# Patient Record
Sex: Female | Born: 1965 | Hispanic: Yes | Marital: Married | Smoking: Never smoker
Health system: Southern US, Community
[De-identification: ages and names within clinical notes are randomized; demographics above are authoritative.]

## PROBLEM LIST (undated history)

## (undated) DIAGNOSIS — I8393 Asymptomatic varicose veins of bilateral lower extremities: Secondary | ICD-10-CM

## (undated) DIAGNOSIS — E785 Hyperlipidemia, unspecified: Secondary | ICD-10-CM

## (undated) DIAGNOSIS — I10 Essential (primary) hypertension: Secondary | ICD-10-CM

## (undated) DIAGNOSIS — E119 Type 2 diabetes mellitus without complications: Secondary | ICD-10-CM

## (undated) HISTORY — PX: CARDIAC CATHETERIZATION: SHX172

---

## 2006-09-04 ENCOUNTER — Ambulatory Visit: Payer: Self-pay

## 2006-09-24 ENCOUNTER — Other Ambulatory Visit: Payer: Self-pay

## 2006-09-24 ENCOUNTER — Emergency Department: Payer: Self-pay | Admitting: Unknown Physician Specialty

## 2006-09-25 ENCOUNTER — Ambulatory Visit: Payer: Self-pay | Admitting: Unknown Physician Specialty

## 2013-01-28 ENCOUNTER — Ambulatory Visit: Payer: Self-pay

## 2013-04-15 ENCOUNTER — Ambulatory Visit: Payer: Self-pay | Admitting: Internal Medicine

## 2014-03-02 ENCOUNTER — Ambulatory Visit: Payer: Self-pay | Admitting: Nurse Practitioner

## 2015-02-23 ENCOUNTER — Other Ambulatory Visit: Payer: Self-pay | Admitting: Nurse Practitioner

## 2015-02-23 DIAGNOSIS — Z1231 Encounter for screening mammogram for malignant neoplasm of breast: Secondary | ICD-10-CM

## 2015-03-08 ENCOUNTER — Ambulatory Visit
Admission: RE | Admit: 2015-03-08 | Discharge: 2015-03-08 | Disposition: A | Discharge status: Home / Self Care | Payer: BLUE CROSS/BLUE SHIELD | Source: Ambulatory Visit | Attending: Nurse Practitioner | Admitting: Nurse Practitioner

## 2015-03-08 DIAGNOSIS — Z1231 Encounter for screening mammogram for malignant neoplasm of breast: Secondary | ICD-10-CM | POA: Insufficient documentation

## 2016-03-20 ENCOUNTER — Other Ambulatory Visit: Payer: Self-pay | Admitting: Nurse Practitioner

## 2016-03-20 DIAGNOSIS — Z1231 Encounter for screening mammogram for malignant neoplasm of breast: Secondary | ICD-10-CM

## 2016-03-26 ENCOUNTER — Ambulatory Visit
Admission: RE | Admit: 2016-03-26 | Discharge: 2016-03-26 | Disposition: A | Discharge status: Home / Self Care | Payer: BLUE CROSS/BLUE SHIELD | Source: Ambulatory Visit | Attending: Nurse Practitioner | Admitting: Nurse Practitioner

## 2016-03-26 ENCOUNTER — Other Ambulatory Visit: Payer: Self-pay | Admitting: Nurse Practitioner

## 2016-03-26 DIAGNOSIS — Z1231 Encounter for screening mammogram for malignant neoplasm of breast: Secondary | ICD-10-CM

## 2016-10-08 ENCOUNTER — Encounter: Payer: Self-pay | Admitting: *Deleted

## 2016-10-09 ENCOUNTER — Encounter
Admission: RE | Disposition: A | Discharge status: Home / Self Care | Payer: Self-pay | Source: Ambulatory Visit | Attending: Gastroenterology

## 2016-10-09 ENCOUNTER — Encounter: Payer: Self-pay | Admitting: *Deleted

## 2016-10-09 ENCOUNTER — Ambulatory Visit: Payer: BLUE CROSS/BLUE SHIELD | Admitting: Anesthesiology

## 2016-10-09 ENCOUNTER — Ambulatory Visit
Admission: RE | Admit: 2016-10-09 | Discharge: 2016-10-09 | Disposition: A | Discharge status: Home / Self Care | Payer: BLUE CROSS/BLUE SHIELD | Source: Ambulatory Visit | Attending: Gastroenterology | Admitting: Gastroenterology

## 2016-10-09 DIAGNOSIS — K640 First degree hemorrhoids: Secondary | ICD-10-CM | POA: Diagnosis not present

## 2016-10-09 DIAGNOSIS — I10 Essential (primary) hypertension: Secondary | ICD-10-CM | POA: Diagnosis not present

## 2016-10-09 DIAGNOSIS — Z1211 Encounter for screening for malignant neoplasm of colon: Secondary | ICD-10-CM | POA: Diagnosis not present

## 2016-10-09 DIAGNOSIS — E1151 Type 2 diabetes mellitus with diabetic peripheral angiopathy without gangrene: Secondary | ICD-10-CM | POA: Diagnosis not present

## 2016-10-09 DIAGNOSIS — E785 Hyperlipidemia, unspecified: Secondary | ICD-10-CM | POA: Insufficient documentation

## 2016-10-09 DIAGNOSIS — K5730 Diverticulosis of large intestine without perforation or abscess without bleeding: Secondary | ICD-10-CM | POA: Insufficient documentation

## 2016-10-09 DIAGNOSIS — D125 Benign neoplasm of sigmoid colon: Secondary | ICD-10-CM | POA: Diagnosis not present

## 2016-10-09 DIAGNOSIS — Z7984 Long term (current) use of oral hypoglycemic drugs: Secondary | ICD-10-CM | POA: Diagnosis not present

## 2016-10-09 DIAGNOSIS — K621 Rectal polyp: Secondary | ICD-10-CM | POA: Insufficient documentation

## 2016-10-09 DIAGNOSIS — Z79899 Other long term (current) drug therapy: Secondary | ICD-10-CM | POA: Diagnosis not present

## 2016-10-09 HISTORY — DX: Type 2 diabetes mellitus without complications: E11.9

## 2016-10-09 HISTORY — PX: COLONOSCOPY WITH PROPOFOL: SHX5780

## 2016-10-09 HISTORY — DX: Essential (primary) hypertension: I10

## 2016-10-09 HISTORY — DX: Asymptomatic varicose veins of bilateral lower extremities: I83.93

## 2016-10-09 HISTORY — DX: Hyperlipidemia, unspecified: E78.5

## 2016-10-09 LAB — GLUCOSE, CAPILLARY: GLUCOSE-CAPILLARY: 81 mg/dL (ref 65–99)

## 2016-10-09 SURGERY — COLONOSCOPY WITH PROPOFOL
Anesthesia: General

## 2016-10-09 MED ORDER — SODIUM CHLORIDE 0.9 % IV SOLN
INTRAVENOUS | Status: DC
  Administered 2016-10-09 (×3): via INTRAVENOUS

## 2016-10-09 MED ORDER — SODIUM CHLORIDE 0.9 % IV SOLN: INTRAVENOUS | Status: DC

## 2016-10-09 MED ORDER — EPHEDRINE SULFATE 50 MG/ML IJ SOLN
INTRAMUSCULAR | Status: DC | PRN
  Administered 2016-10-09: 10 mg via INTRAVENOUS

## 2016-10-09 MED ORDER — PROPOFOL 500 MG/50ML IV EMUL
INTRAVENOUS | Status: DC | PRN
  Administered 2016-10-09: 100 ug/kg/min via INTRAVENOUS

## 2016-10-09 NOTE — OR Nursing (Signed)
Left hand IV discontinued with intact catheter and site without redness or bruising.  Small iv dressing applied .

## 2016-10-09 NOTE — H&P (Signed)
Outpatient short stay form Pre-procedure 10/09/2016 12:58 PM Lollie Sails MD  Primary Physician: Gaetano Net NP  Reason for visit:  Colonoscopy  History of present illness:  Patient is a 51 year old female presenting today for screening colonoscopy. This her first colonoscopy. She tolerated her prep well. She takes no aspirin or blood thinning agents.    Current Facility-Administered Medications:  .  0.9 %  sodium chloride infusion, , Intravenous, Continuous, Lollie Sails, MD, Last Rate: 20 mL/hr at 10/09/16 1223 .  0.9 %  sodium chloride infusion, , Intravenous, Continuous, Lollie Sails, MD  Prescriptions Prior to Admission  Medication Sig Dispense Refill Last Dose  . carvedilol (COREG) 25 MG tablet Take 25 mg by mouth 2 (two) times daily with a meal.   10/08/2016 at Unknown time  . cyanocobalamin 1000 MCG tablet Take 1,000 mcg by mouth daily.   10/08/2016 at Unknown time  . hydrochlorothiazide (HYDRODIURIL) 25 MG tablet Take 25 mg by mouth daily.   10/08/2016 at Unknown time  . losartan (COZAAR) 100 MG tablet Take 100 mg by mouth daily.   10/08/2016 at Unknown time  . metFORMIN (GLUCOPHAGE-XR) 500 MG 24 hr tablet Take 1,000 mg by mouth daily with breakfast.   Past Week at Unknown time  . Multiple Vitamin (MULTIVITAMIN) tablet Take 1 tablet by mouth daily.   Past Week at Unknown time  . pravastatin (PRAVACHOL) 20 MG tablet Take 20 mg by mouth daily.   10/08/2016 at Unknown time     Allergies  Allergen Reactions  . Penicillins Other (See Comments)    Anxiety     Past Medical History:  Diagnosis Date  . Diabetes mellitus without complication ([place])   . Hyperlipidemia   . Hypertension   . Varicose veins of legs     Review of systems:      Physical Exam    Heart and lungs: Regular rate and rhythm without rub or gallop, lungs are bilaterally clear.    HEENT: Normocephalic atraumatic eyes are anicteric    Other:     Pertinant exam for procedure: Soft nontender  nondistended bowel sounds positive normoactive.    Planned proceedures: Colonoscopy and indicated procedures. I have discussed the risks benefits and complications of procedures to include not limited to bleeding, infection, perforation and the risk of sedation and the patient wishes to proceed.    Lollie Sails, MD Gastroenterology 10/09/2016  12:58 PM

## 2016-10-09 NOTE — Anesthesia Post-op Follow-up Note (Cosign Needed)
Anesthesia QCDR form completed.        

## 2016-10-09 NOTE — Anesthesia Preprocedure Evaluation (Addendum)
Anesthesia Evaluation  Patient identified by MRN, date of birth, ID band Patient awake    Reviewed: Allergy & Precautions, NPO status , Patient's Chart, lab work & pertinent test results, reviewed documented beta blocker date and time   Airway Mallampati: II       Dental no notable dental hx. (+) Chipped   Pulmonary neg pulmonary ROS,    Pulmonary exam normal        Cardiovascular hypertension, Pt. on medications and Pt. on home beta blockers + Peripheral Vascular Disease  Normal cardiovascular exam     Neuro/Psych negative neurological ROS  negative psych ROS   GI/Hepatic negative GI ROS, Neg liver ROS,   Endo/Other  diabetes, Well Controlled, Type 2, Oral Hypoglycemic Agents  Renal/GU negative Renal ROS  negative genitourinary   Musculoskeletal negative musculoskeletal ROS (+)   Abdominal Normal abdominal exam  (+)   Peds  Hematology negative hematology ROS (+)   Anesthesia Other Findings   Reproductive/Obstetrics                            Anesthesia Physical Anesthesia Plan  ASA: III  Anesthesia Plan: General   Post-op Pain Management:    Induction: Intravenous  Airway Management Planned: Nasal Cannula  Additional Equipment:   Intra-op Plan:   Post-operative Plan:   Informed Consent: I have reviewed the patients History and Physical, chart, labs and discussed the procedure including the risks, benefits and alternatives for the proposed anesthesia with the patient or authorized representative who has indicated his/her understanding and acceptance.     Plan Discussed with: CRNA and Surgeon  Anesthesia Plan Comments:         Anesthesia Quick Evaluation

## 2016-10-09 NOTE — Op Note (Signed)
[facility] Gastroenterology Patient Name: Becky Krueger Procedure Date: 10/09/2016 12:21 PM MRN: [id number] Account #: [id number] Date of Birth: [birth year] Admit Type: Outpatient Age: 51 Room: [facility] ENDO ROOM 1 Gender: Female Note Status: Finalized Procedure:            Colonoscopy Indications:          Screening for colorectal malignant neoplasm, This is                        the patient's first colonoscopy Providers:            Lollie Sails, MD Referring MD:         Juluis Rainier (Referring MD) Medicines:            Monitored Anesthesia Care Complications:        No immediate complications. Procedure:            Pre-Anesthesia Assessment:                       - ASA Grade Assessment: II - A patient with mild                        systemic disease.                       After obtaining informed consent, the colonoscope was                        passed under direct vision. Throughout the procedure,                        the patient's blood pressure, pulse, and oxygen                        saturations were monitored continuously. The                        Colonoscope was introduced through the anus and                        advanced to the the cecum, identified by appendiceal                        orifice and ileocecal valve. The colonoscopy was                        performed without difficulty. The patient tolerated the                        procedure well. The quality of the bowel preparation                        was good. Findings:      A 4 mm polyp was found in the distal sigmoid colon. The polyp was       pedunculated. The polyp was removed with a cold snare. Resection and       retrieval were complete.      A 3 mm polyp was found in the rectum. The polyp was flat. The polyp was       removed with a cold biopsy forceps. Resection and retrieval  were       complete.      Non-bleeding internal hemorrhoids were found during  retroflexion and       during anoscopy. The hemorrhoids were small and Grade I (internal       hemorrhoids that do not prolapse).      Multiple small-mouthed diverticula were found in the sigmoid colon and       descending colon. Impression:           - One 4 mm polyp in the distal sigmoid colon, removed                        with a cold snare. Resected and retrieved.                       - One 3 mm polyp in the rectum, removed with a cold                        biopsy forceps. Resected and retrieved.                       - Non-bleeding internal hemorrhoids. Recommendation:       - Discharge patient to home.                       - Advance diet as tolerated. Procedure Code(s):    --- Professional ---                       [phone], Colonoscopy, flexible; with removal of tumor(s),                        polyp(s), or other lesion(s) by snare technique                       45380, 73, Colonoscopy, flexible; with biopsy, single                        or multiple Diagnosis Code(s):    --- Professional ---                       Z12.11, Encounter for screening for malignant neoplasm                        of colon                       D12.5, Benign neoplasm of sigmoid colon                       K62.1, Rectal polyp                       K64.0, First degree hemorrhoids CPT copyright 2016 American Medical Association. All rights reserved. The codes documented in this report are preliminary and upon coder review may  be revised to meet current compliance requirements. Lollie Sails, MD 10/09/2016 1:33:56 PM This report has been signed electronically. Number of Addenda: 0 Note Initiated On: 10/09/2016 12:21 PM Scope Withdrawal Time: 0 hours 14 minutes 32 seconds  Total Procedure Duration: 0 hours 24 minutes 10 seconds       [facility]

## 2016-10-09 NOTE — Transfer of Care (Signed)
Immediate Anesthesia Transfer of Care Note  Patient: Becky Krueger  Procedure(s) Performed: Procedure(s): COLONOSCOPY WITH PROPOFOL (N/A)  Patient Location: PACU  Anesthesia Type:General  Level of Consciousness: awake, alert  and oriented  Airway & Oxygen Therapy: Patient Spontanous Breathing and Patient connected to nasal cannula oxygen  Post-op Assessment: Report given to RN and Post -op Vital signs reviewed and stable  Post vital signs: Reviewed and stable  Last Vitals:  Vitals:   10/09/16 1153 10/09/16 1336  BP: (!) 171/84 (!) 100/49  Pulse: 85 86  Resp: 20 18  Temp: 37.2 C (!) 35.6 C    Last Pain:  Vitals:   10/09/16 1336  TempSrc: Tympanic  PainSc:       Patients Stated Pain Goal: 0 [phone])  Complications: No apparent anesthesia complications

## 2016-10-10 ENCOUNTER — Encounter: Payer: Self-pay | Admitting: Gastroenterology

## 2016-10-10 LAB — SURGICAL PATHOLOGY

## 2016-10-12 NOTE — Anesthesia Postprocedure Evaluation (Signed)
Anesthesia Post Note  Patient: Becky Krueger  Procedure(s) Performed: Procedure(s) (LRB): COLONOSCOPY WITH PROPOFOL (N/A)  Patient location during evaluation: Endoscopy Anesthesia Type: General Level of consciousness: awake and alert Pain management: pain level controlled Vital Signs Assessment: post-procedure vital signs reviewed and stable Respiratory status: spontaneous breathing, nonlabored ventilation, respiratory function stable and patient connected to nasal cannula oxygen Cardiovascular status: blood pressure returned to baseline and stable Postop Assessment: no signs of nausea or vomiting Anesthetic complications: no     Last Vitals:  Vitals:   10/09/16 1356 10/09/16 1416  BP: (!) 109/55 105/70  Pulse: 71 73  Resp: 17 18  Temp:      Last Pain:  Vitals:   10/10/16 0745  TempSrc:   PainSc: 0-No pain                 Martha Clan

## 2017-02-28 ENCOUNTER — Other Ambulatory Visit: Payer: Self-pay | Admitting: Nurse Practitioner

## 2017-02-28 DIAGNOSIS — Z1231 Encounter for screening mammogram for malignant neoplasm of breast: Secondary | ICD-10-CM

## 2017-03-27 ENCOUNTER — Ambulatory Visit
Admission: RE | Admit: 2017-03-27 | Discharge: 2017-03-27 | Disposition: A | Discharge status: Home / Self Care | Payer: BLUE CROSS/BLUE SHIELD | Source: Ambulatory Visit | Attending: Nurse Practitioner | Admitting: Nurse Practitioner

## 2017-03-27 DIAGNOSIS — Z1231 Encounter for screening mammogram for malignant neoplasm of breast: Secondary | ICD-10-CM | POA: Diagnosis present

## 2017-04-03 ENCOUNTER — Ambulatory Visit: Payer: BLUE CROSS/BLUE SHIELD

## 2018-04-30 ENCOUNTER — Other Ambulatory Visit: Payer: Self-pay | Admitting: Nurse Practitioner

## 2018-04-30 DIAGNOSIS — Z1231 Encounter for screening mammogram for malignant neoplasm of breast: Secondary | ICD-10-CM

## 2018-06-05 ENCOUNTER — Encounter (INDEPENDENT_AMBULATORY_CARE_PROVIDER_SITE_OTHER): Payer: Self-pay

## 2018-06-05 ENCOUNTER — Ambulatory Visit
Admission: RE | Admit: 2018-06-05 | Discharge: 2018-06-05 | Disposition: A | Discharge status: Home / Self Care | Payer: BLUE CROSS/BLUE SHIELD | Source: Ambulatory Visit | Attending: Nurse Practitioner | Admitting: Nurse Practitioner

## 2018-06-05 DIAGNOSIS — Z1231 Encounter for screening mammogram for malignant neoplasm of breast: Secondary | ICD-10-CM | POA: Insufficient documentation

## 2019-05-15 ENCOUNTER — Other Ambulatory Visit: Payer: Self-pay | Admitting: Nurse Practitioner

## 2019-05-15 DIAGNOSIS — Z1231 Encounter for screening mammogram for malignant neoplasm of breast: Secondary | ICD-10-CM

## 2019-06-15 ENCOUNTER — Other Ambulatory Visit: Payer: Self-pay

## 2019-06-15 DIAGNOSIS — Z20822 Contact with and (suspected) exposure to covid-19: Secondary | ICD-10-CM

## 2019-06-17 ENCOUNTER — Ambulatory Visit: Payer: BLUE CROSS/BLUE SHIELD

## 2019-06-18 LAB — NOVEL CORONAVIRUS, NAA: SARS-CoV-2, NAA: DETECTED — AB

## 2019-07-20 ENCOUNTER — Encounter (INDEPENDENT_AMBULATORY_CARE_PROVIDER_SITE_OTHER): Payer: Self-pay

## 2019-07-20 ENCOUNTER — Ambulatory Visit
Admission: RE | Admit: 2019-07-20 | Discharge: 2019-07-20 | Disposition: A | Discharge status: Home / Self Care | Payer: BLUE CROSS/BLUE SHIELD | Source: Ambulatory Visit | Attending: Nurse Practitioner | Admitting: Nurse Practitioner

## 2019-07-20 ENCOUNTER — Other Ambulatory Visit: Payer: Self-pay

## 2019-07-20 DIAGNOSIS — Z1231 Encounter for screening mammogram for malignant neoplasm of breast: Secondary | ICD-10-CM | POA: Diagnosis not present

## 2020-08-30 ENCOUNTER — Other Ambulatory Visit: Payer: Self-pay | Admitting: Nurse Practitioner

## 2020-08-30 DIAGNOSIS — Z1231 Encounter for screening mammogram for malignant neoplasm of breast: Secondary | ICD-10-CM

## 2020-09-08 ENCOUNTER — Other Ambulatory Visit: Payer: Self-pay

## 2020-09-08 ENCOUNTER — Ambulatory Visit
Admission: RE | Admit: 2020-09-08 | Discharge: 2020-09-08 | Disposition: A | Discharge status: Home / Self Care | Payer: 59 | Source: Ambulatory Visit | Attending: Nurse Practitioner | Admitting: Nurse Practitioner

## 2020-09-08 DIAGNOSIS — Z1231 Encounter for screening mammogram for malignant neoplasm of breast: Secondary | ICD-10-CM | POA: Diagnosis present

## 2021-08-08 ENCOUNTER — Other Ambulatory Visit: Payer: Self-pay | Admitting: Nurse Practitioner

## 2021-08-08 DIAGNOSIS — Z1231 Encounter for screening mammogram for malignant neoplasm of breast: Secondary | ICD-10-CM

## 2021-09-18 ENCOUNTER — Other Ambulatory Visit: Payer: Self-pay

## 2021-09-18 ENCOUNTER — Ambulatory Visit
Admission: RE | Admit: 2021-09-18 | Discharge: 2021-09-18 | Disposition: A | Discharge status: Home / Self Care | Payer: 59 | Source: Ambulatory Visit | Attending: Nurse Practitioner | Admitting: Nurse Practitioner

## 2021-09-18 DIAGNOSIS — Z1231 Encounter for screening mammogram for malignant neoplasm of breast: Secondary | ICD-10-CM | POA: Diagnosis not present

## 2021-09-29 DIAGNOSIS — E119 Type 2 diabetes mellitus without complications: Secondary | ICD-10-CM | POA: Diagnosis not present

## 2021-09-29 DIAGNOSIS — I10 Essential (primary) hypertension: Secondary | ICD-10-CM | POA: Diagnosis not present

## 2021-09-29 DIAGNOSIS — Z0000 Encounter for general adult medical examination without abnormal findings: Secondary | ICD-10-CM | POA: Diagnosis not present

## 2021-09-29 DIAGNOSIS — E782 Mixed hyperlipidemia: Secondary | ICD-10-CM | POA: Diagnosis not present

## 2021-09-29 DIAGNOSIS — Z79899 Other long term (current) drug therapy: Secondary | ICD-10-CM | POA: Diagnosis not present

## 2022-04-10 DIAGNOSIS — E119 Type 2 diabetes mellitus without complications: Secondary | ICD-10-CM | POA: Diagnosis not present

## 2022-04-10 DIAGNOSIS — E669 Obesity, unspecified: Secondary | ICD-10-CM | POA: Diagnosis not present

## 2022-04-10 DIAGNOSIS — Z79899 Other long term (current) drug therapy: Secondary | ICD-10-CM | POA: Diagnosis not present

## 2022-04-10 DIAGNOSIS — I10 Essential (primary) hypertension: Secondary | ICD-10-CM | POA: Diagnosis not present

## 2022-04-10 DIAGNOSIS — E782 Mixed hyperlipidemia: Secondary | ICD-10-CM | POA: Diagnosis not present

## 2022-04-10 DIAGNOSIS — K219 Gastro-esophageal reflux disease without esophagitis: Secondary | ICD-10-CM | POA: Diagnosis not present

## 2022-04-10 DIAGNOSIS — M62830 Muscle spasm of back: Secondary | ICD-10-CM | POA: Diagnosis not present

## 2022-04-10 DIAGNOSIS — M25522 Pain in left elbow: Secondary | ICD-10-CM | POA: Diagnosis not present

## 2022-05-08 DIAGNOSIS — Z86010 Personal history of colonic polyps: Secondary | ICD-10-CM | POA: Diagnosis not present

## 2022-07-24 ENCOUNTER — Encounter: Payer: Self-pay | Admitting: Internal Medicine

## 2022-07-25 ENCOUNTER — Ambulatory Visit
Admission: RE | Admit: 2022-07-25 | Discharge: 2022-07-25 | Disposition: A | Discharge status: Home / Self Care | Payer: 59 | Attending: Internal Medicine | Admitting: Internal Medicine

## 2022-07-25 ENCOUNTER — Ambulatory Visit: Payer: 59 | Admitting: Certified Registered"

## 2022-07-25 ENCOUNTER — Encounter: Payer: Self-pay | Admitting: Internal Medicine

## 2022-07-25 ENCOUNTER — Encounter
Admission: RE | Disposition: A | Discharge status: Home / Self Care | Payer: Self-pay | Source: Home / Self Care | Attending: Internal Medicine

## 2022-07-25 ENCOUNTER — Other Ambulatory Visit: Payer: Self-pay

## 2022-07-25 DIAGNOSIS — K635 Polyp of colon: Secondary | ICD-10-CM | POA: Insufficient documentation

## 2022-07-25 DIAGNOSIS — Z86010 Personal history of colonic polyps: Secondary | ICD-10-CM | POA: Diagnosis not present

## 2022-07-25 DIAGNOSIS — Z79899 Other long term (current) drug therapy: Secondary | ICD-10-CM | POA: Diagnosis not present

## 2022-07-25 DIAGNOSIS — K640 First degree hemorrhoids: Secondary | ICD-10-CM | POA: Diagnosis not present

## 2022-07-25 DIAGNOSIS — E119 Type 2 diabetes mellitus without complications: Secondary | ICD-10-CM | POA: Insufficient documentation

## 2022-07-25 DIAGNOSIS — K5790 Diverticulosis of intestine, part unspecified, without perforation or abscess without bleeding: Secondary | ICD-10-CM | POA: Diagnosis not present

## 2022-07-25 DIAGNOSIS — Z1211 Encounter for screening for malignant neoplasm of colon: Secondary | ICD-10-CM | POA: Diagnosis not present

## 2022-07-25 DIAGNOSIS — Z7984 Long term (current) use of oral hypoglycemic drugs: Secondary | ICD-10-CM | POA: Diagnosis not present

## 2022-07-25 DIAGNOSIS — I10 Essential (primary) hypertension: Secondary | ICD-10-CM | POA: Diagnosis not present

## 2022-07-25 DIAGNOSIS — D125 Benign neoplasm of sigmoid colon: Secondary | ICD-10-CM | POA: Diagnosis not present

## 2022-07-25 DIAGNOSIS — K5730 Diverticulosis of large intestine without perforation or abscess without bleeding: Secondary | ICD-10-CM | POA: Insufficient documentation

## 2022-07-25 DIAGNOSIS — E785 Hyperlipidemia, unspecified: Secondary | ICD-10-CM | POA: Diagnosis not present

## 2022-07-25 HISTORY — PX: COLONOSCOPY WITH PROPOFOL: SHX5780

## 2022-07-25 LAB — GLUCOSE, CAPILLARY: Glucose-Capillary: 104 mg/dL — ABNORMAL HIGH (ref 70–99)

## 2022-07-25 SURGERY — COLONOSCOPY WITH PROPOFOL
Anesthesia: General

## 2022-07-25 MED ORDER — LIDOCAINE HCL (PF) 2 % IJ SOLN
INTRAMUSCULAR | Status: AC
  Filled 2022-07-25: qty 5

## 2022-07-25 MED ORDER — PROPOFOL 1000 MG/100ML IV EMUL
INTRAVENOUS | Status: AC
  Filled 2022-07-25: qty 100

## 2022-07-25 MED ORDER — SODIUM CHLORIDE 0.9 % IV SOLN: INTRAVENOUS | Status: DC

## 2022-07-25 MED ORDER — LIDOCAINE HCL (CARDIAC) PF 100 MG/5ML IV SOSY
PREFILLED_SYRINGE | INTRAVENOUS | Status: DC | PRN
  Administered 2022-07-25: 100 mg via INTRAVENOUS

## 2022-07-25 MED ORDER — PROPOFOL 500 MG/50ML IV EMUL
INTRAVENOUS | Status: DC | PRN
  Administered 2022-07-25: 140 ug/kg/min via INTRAVENOUS
  Administered 2022-07-25: 100 mg via INTRAVENOUS

## 2022-07-25 NOTE — Op Note (Signed)
[facility] Gastroenterology Patient Name: Becky Krueger Procedure Date: 07/25/2022 10:34 AM MRN: [id number] Account #: [id number] Date of Birth: [birth year] Admit Type: Outpatient Age: 56 Room: [facility] ENDO ROOM 2 Gender: Female Note Status: Finalized Instrument Name: Park Meo [id number] Procedure:             Colonoscopy Indications:           Surveillance: Personal history of adenomatous polyps                         on last colonoscopy > 5 years ago Providers:             Lorie Apley K. Tarnesha Ulloa MD, MD Medicines:             Propofol per Anesthesia Complications:         No immediate complications. Procedure:             Pre-Anesthesia Assessment:                        - The risks and benefits of the procedure and the                         sedation options and risks were discussed with the                         patient. All questions were answered and informed                         consent was obtained.                        - Patient identification and proposed procedure were                         verified prior to the procedure by the nurse. The                         procedure was verified in the procedure room.                        - ASA Grade Assessment: III - A patient with severe                         systemic disease.                        - After reviewing the risks and benefits, the patient                         was deemed in satisfactory condition to undergo the                         procedure.                        After obtaining informed consent, the colonoscope was                         passed under direct vision. Throughout the procedure,  the patient's blood pressure, pulse, and oxygen                         saturations were monitored continuously. The                         Colonoscope was introduced through the anus and                         advanced to the the cecum, identified by appendiceal                          orifice and ileocecal valve. The colonoscopy was                         performed without difficulty. The patient tolerated                         the procedure well. The quality of the bowel                         preparation was adequate. The ileocecal valve,                         appendiceal orifice, and rectum were photographed. Findings:      The perianal and digital rectal examinations were normal. Pertinent       negatives include normal sphincter tone and no palpable rectal lesions.      Non-bleeding internal hemorrhoids were found during retroflexion. The       hemorrhoids were Grade I (internal hemorrhoids that do not prolapse).      Many small-mouthed diverticula were found in the sigmoid colon.      A 5 mm polyp was found in the distal sigmoid colon. The polyp was       sessile. The polyp was removed with a jumbo cold forceps. Resection and       retrieval were complete.      The exam was otherwise without abnormality. Impression:            - Non-bleeding internal hemorrhoids.                        - Diverticulosis in the sigmoid colon.                        - One 5 mm polyp in the distal sigmoid colon, removed                         with a jumbo cold forceps. Resected and retrieved.                        - The examination was otherwise normal. Recommendation:        - Patient has a contact number available for                         emergencies. The signs and symptoms of potential                         delayed complications were discussed with  the patient.                         Return to normal activities tomorrow. Written                         discharge instructions were provided to the patient.                        - Resume previous diet.                        - Continue present medications.                        - Repeat colonoscopy is recommended for surveillance.                         The colonoscopy date will be determined  after                         pathology results from today's exam become available                         for review.                        - Return to GI office PRN.                        - The findings and recommendations were discussed with                         the patient. Procedure Code(s):     --- Professional ---                        [phone], Colonoscopy, flexible; with biopsy, single or                         multiple Diagnosis Code(s):     --- Professional ---                        K57.30, Diverticulosis of large intestine without                         perforation or abscess without bleeding                        D12.5, Benign neoplasm of sigmoid colon                        K64.0, First degree hemorrhoids                        Z86.010, Personal history of colonic polyps CPT copyright 2022 American Medical Association. All rights reserved. The codes documented in this report are preliminary and upon coder review may  be revised to meet current compliance requirements. Efrain Sella MD, MD 07/25/2022 10:58:33 AM This report has been signed electronically. Number of Addenda: 0 Note Initiated On: 07/25/2022 10:34 AM Scope Withdrawal Time: 0 hours 6 minutes 33  seconds  Total Procedure Duration: 0 hours 10 minutes 2 seconds  Estimated Blood Loss:  Estimated blood loss: none.      [facility]

## 2022-07-25 NOTE — Anesthesia Preprocedure Evaluation (Signed)
Anesthesia Evaluation  Patient identified by MRN, date of birth, ID band Patient awake    Reviewed: Allergy & Precautions, H&P , NPO status , Patient's Chart, lab work & pertinent test results, reviewed documented beta blocker date and time   Airway Mallampati: II   Neck ROM: full    Dental  (+) Poor Dentition   Pulmonary neg pulmonary ROS   Pulmonary exam normal        Cardiovascular Exercise Tolerance: Good hypertension, On Medications negative cardio ROS Normal cardiovascular exam Rhythm:regular Rate:Normal     Neuro/Psych negative neurological ROS  negative psych ROS   GI/Hepatic negative GI ROS, Neg liver ROS,,,  Endo/Other  negative endocrine ROSdiabetes, Well Controlled    Renal/GU negative Renal ROS  negative genitourinary   Musculoskeletal   Abdominal   Peds  Hematology negative hematology ROS (+)   Anesthesia Other Findings Past Medical History: No date: Diabetes mellitus without complication ([facility]) No date: Hyperlipidemia No date: Hypertension No date: Varicose veins of legs Past Surgical History: No date: CARDIAC CATHETERIZATION     Comment:  LBBB on EKG.  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  S/P cath showing no blockages and normal EF 10/09/2016: COLONOSCOPY WITH PROPOFOL; N/A     Comment:  Procedure: COLONOSCOPY WITH PROPOFOL;  Surgeon: Lollie Sails, MD;  Location: [facility] ENDOSCOPY;  Service:               Endoscopy;  Laterality: N/A; BMI    Body Mass Index: 29.94 kg/m     Reproductive/Obstetrics negative OB ROS                             Anesthesia Physical Anesthesia Plan  ASA: 2  Anesthesia Plan: General   Post-op Pain Management:    Induction:   PONV Risk Score and Plan:   Airway Management Planned:   Additional Equipment:   Intra-op Plan:   Post-operative Plan:   Informed Consent: I have reviewed the  patients History and Physical, chart, labs and discussed the procedure including the risks, benefits and alternatives for the proposed anesthesia with the patient or authorized representative who has indicated his/her understanding and acceptance.     Dental Advisory Given  Plan Discussed with: CRNA  Anesthesia Plan Comments:        Anesthesia Quick Evaluation

## 2022-07-25 NOTE — Transfer of Care (Signed)
Immediate Anesthesia Transfer of Care Note  Patient: Becky Krueger  Procedure(s) Performed: COLONOSCOPY WITH PROPOFOL  Patient Location: Endoscopy Unit  Anesthesia Type:General  Level of Consciousness: drowsy  Airway & Oxygen Therapy: Patient Spontanous Breathing  Post-op Assessment: Report given to RN and Post -op Vital signs reviewed and stable  Post vital signs: Reviewed and stable  Last Vitals:  Vitals Value Taken Time  BP 96/63 07/25/22 1058  Temp 35.7 C 07/25/22 1058  Pulse 73 07/25/22 1058  Resp 17 07/25/22 1058  SpO2 97 % 07/25/22 1058    Last Pain:  Vitals:   07/25/22 1058  TempSrc: Temporal  PainSc: Asleep         Complications: No notable events documented.

## 2022-07-25 NOTE — Interval H&P Note (Signed)
History and Physical Interval Note:  07/25/2022 10:37 AM  Becky Krueger  has presented today for surgery, with the diagnosis of personal history adenomatous polyp.  The various methods of treatment have been discussed with the patient and family. After consideration of risks, benefits and other options for treatment, the patient has consented to  Procedure(s) with comments: COLONOSCOPY WITH PROPOFOL (N/A) - SPANISH INTERPRETER as a surgical intervention.  The patient's history has been reviewed, patient examined, no change in status, stable for surgery.  I have reviewed the patient's chart and labs.  Questions were answered to the patient's satisfaction.     [place], [place]

## 2022-07-25 NOTE — H&P (Signed)
  Outpatient short stay form Pre-procedure 07/25/2022 10:35 AM Erving Sassano K. Alice Reichert, M.D.  Primary Physician: Gaetano Net, NP  Reason for visit:  Personal history of adenomatous colon polyps  History of present illness:  56 y/o female with personal history of colon polyps.  10/09/2016 Physician: Dr. Kurtis Bushman @ [facility] Polyps Removed: Adenomatous Polyp Patient denies change in bowel habits, rectal bleeding, weight loss or abdominal pain.  History and consent obtained via Medical Spanish interpreter Kennyth Lose).    Current Facility-Administered Medications:    0.9 %  sodium chloride infusion, , Intravenous, Continuous, Virgal [place], Benay Pike, MD, Last Rate: 20 mL/hr at 07/25/22 0950, New Bag at 07/25/22 0950  Medications Prior to Admission  Medication Sig Dispense Refill Last Dose   carvedilol (COREG) 25 MG tablet Take 25 mg by mouth 2 (two) times daily with a meal.   07/25/2022   cyanocobalamin 1000 MCG tablet Take 1,000 mcg by mouth daily.   Past Week   hydrochlorothiazide (HYDRODIURIL) 25 MG tablet Take 25 mg by mouth daily.   Past Week   losartan (COZAAR) 100 MG tablet Take 100 mg by mouth daily.   Past Week   metFORMIN (GLUCOPHAGE-XR) 500 MG 24 hr tablet Take 1,000 mg by mouth daily with breakfast.   Past Week   Multiple Vitamin (MULTIVITAMIN) tablet Take 1 tablet by mouth daily.   Past Week   pravastatin (PRAVACHOL) 20 MG tablet Take 20 mg by mouth daily.   Past Week     Allergies  Allergen Reactions   Penicillins Other (See Comments)    Anxiety     Past Medical History:  Diagnosis Date   Diabetes mellitus without complication ([place])    Hyperlipidemia    Hypertension    Varicose veins of legs     Review of systems:  Otherwise negative.    Physical Exam  Gen: Alert, oriented. Appears stated age.  HEENT: [place]/AT. PERRLA. Lungs: CTA, no wheezes. CV: RR nl S1, S2. Abd: soft, benign, no masses. BS+ Ext: No edema. Pulses 2+    Planned procedures: Proceed with colonoscopy. The  patient understands the nature of the planned procedure, indications, risks, alternatives and potential complications including but not limited to bleeding, infection, perforation, damage to internal organs and possible oversedation/side effects from anesthesia. The patient agrees and gives consent to proceed.  Please refer to procedure notes for findings, recommendations and patient disposition/instructions.     Errika Narvaiz K. Alice Reichert, M.D. Gastroenterology 07/25/2022  10:35 AM

## 2022-07-26 ENCOUNTER — Encounter: Payer: Self-pay | Admitting: Internal Medicine

## 2022-07-26 LAB — SURGICAL PATHOLOGY

## 2022-07-26 NOTE — Anesthesia Postprocedure Evaluation (Signed)
Anesthesia Post Note  Patient: Becky Krueger  Procedure(s) Performed: COLONOSCOPY WITH PROPOFOL  Patient location during evaluation: PACU Anesthesia Type: General Level of consciousness: awake and alert Pain management: pain level controlled Vital Signs Assessment: post-procedure vital signs reviewed and stable Respiratory status: spontaneous breathing, nonlabored ventilation, respiratory function stable and patient connected to nasal cannula oxygen Cardiovascular status: blood pressure returned to baseline and stable Postop Assessment: no apparent nausea or vomiting Anesthetic complications: no   No notable events documented.   Last Vitals:  Vitals:   07/25/22 1058 07/25/22 1108  BP: 96/63 108/67  Pulse: 73 66  Resp: 17 15  Temp: (!) 35.7 C   SpO2: 97% 98%    Last Pain:  Vitals:   07/26/22 0740  TempSrc:   PainSc: 0-No pain                 Molli Barrows

## 2022-08-14 DIAGNOSIS — Z23 Encounter for immunization: Secondary | ICD-10-CM | POA: Diagnosis not present

## 2022-08-14 DIAGNOSIS — S39013A Strain of muscle, fascia and tendon of pelvis, initial encounter: Secondary | ICD-10-CM | POA: Diagnosis not present

## 2022-08-14 DIAGNOSIS — R1031 Right lower quadrant pain: Secondary | ICD-10-CM | POA: Diagnosis not present

## 2022-10-08 ENCOUNTER — Other Ambulatory Visit: Payer: Self-pay | Admitting: Nurse Practitioner

## 2022-10-08 DIAGNOSIS — Z1231 Encounter for screening mammogram for malignant neoplasm of breast: Secondary | ICD-10-CM

## 2022-10-09 DIAGNOSIS — I10 Essential (primary) hypertension: Secondary | ICD-10-CM | POA: Diagnosis not present

## 2022-10-09 DIAGNOSIS — Z79899 Other long term (current) drug therapy: Secondary | ICD-10-CM | POA: Diagnosis not present

## 2022-10-09 DIAGNOSIS — E782 Mixed hyperlipidemia: Secondary | ICD-10-CM | POA: Diagnosis not present

## 2022-10-09 DIAGNOSIS — H9202 Otalgia, left ear: Secondary | ICD-10-CM | POA: Diagnosis not present

## 2022-10-09 DIAGNOSIS — R42 Dizziness and giddiness: Secondary | ICD-10-CM | POA: Diagnosis not present

## 2022-10-09 DIAGNOSIS — Z1231 Encounter for screening mammogram for malignant neoplasm of breast: Secondary | ICD-10-CM | POA: Diagnosis not present

## 2022-10-09 DIAGNOSIS — E669 Obesity, unspecified: Secondary | ICD-10-CM | POA: Diagnosis not present

## 2022-10-09 DIAGNOSIS — Z0000 Encounter for general adult medical examination without abnormal findings: Secondary | ICD-10-CM | POA: Diagnosis not present

## 2022-10-09 DIAGNOSIS — E119 Type 2 diabetes mellitus without complications: Secondary | ICD-10-CM | POA: Diagnosis not present

## 2022-10-09 DIAGNOSIS — K219 Gastro-esophageal reflux disease without esophagitis: Secondary | ICD-10-CM | POA: Diagnosis not present

## 2022-10-16 ENCOUNTER — Ambulatory Visit
Admission: RE | Admit: 2022-10-16 | Discharge: 2022-10-16 | Disposition: A | Discharge status: Home / Self Care | Payer: 59 | Source: Ambulatory Visit | Attending: Nurse Practitioner | Admitting: Nurse Practitioner

## 2022-10-16 DIAGNOSIS — Z1231 Encounter for screening mammogram for malignant neoplasm of breast: Secondary | ICD-10-CM | POA: Diagnosis not present

## 2023-04-15 DIAGNOSIS — E782 Mixed hyperlipidemia: Secondary | ICD-10-CM | POA: Diagnosis not present

## 2023-04-15 DIAGNOSIS — K219 Gastro-esophageal reflux disease without esophagitis: Secondary | ICD-10-CM | POA: Diagnosis not present

## 2023-04-15 DIAGNOSIS — I10 Essential (primary) hypertension: Secondary | ICD-10-CM | POA: Diagnosis not present

## 2023-04-15 DIAGNOSIS — E669 Obesity, unspecified: Secondary | ICD-10-CM | POA: Diagnosis not present

## 2023-04-15 DIAGNOSIS — Z1331 Encounter for screening for depression: Secondary | ICD-10-CM | POA: Diagnosis not present

## 2023-04-15 DIAGNOSIS — E119 Type 2 diabetes mellitus without complications: Secondary | ICD-10-CM | POA: Diagnosis not present

## 2023-04-15 DIAGNOSIS — I83813 Varicose veins of bilateral lower extremities with pain: Secondary | ICD-10-CM | POA: Diagnosis not present

## 2023-04-15 DIAGNOSIS — Z79899 Other long term (current) drug therapy: Secondary | ICD-10-CM | POA: Diagnosis not present

## 2023-06-05 IMAGING — MG MM DIGITAL SCREENING BILAT W/ TOMO AND CAD
8 series · 8 of 24 positions shown · non-contrast
Comparison: Previous exam(s).

CLINICAL DATA: Screening.

EXAM:
DIGITAL SCREENING BILATERAL MAMMOGRAM WITH TOMOSYNTHESIS AND CAD
TECHNIQUE: Bilateral screening digital craniocaudal and mediolateral oblique
mammograms were obtained. Bilateral screening digital breast
tomosynthesis was performed. The images were evaluated with
computer-aided detection.

[R CC synth-2D]
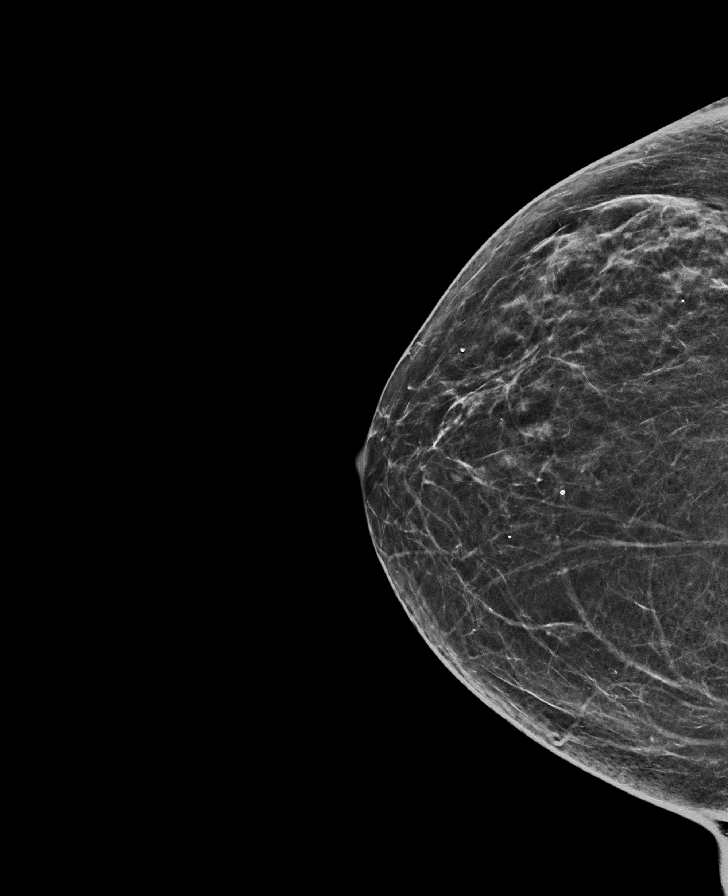

[R MLO synth-2D]
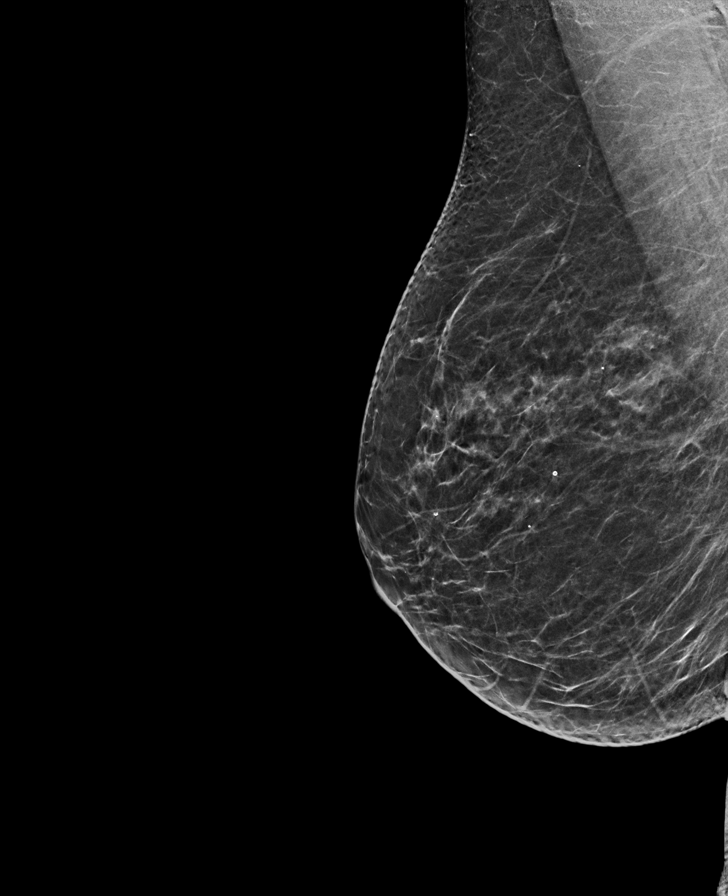

[L MLO synth-2D]
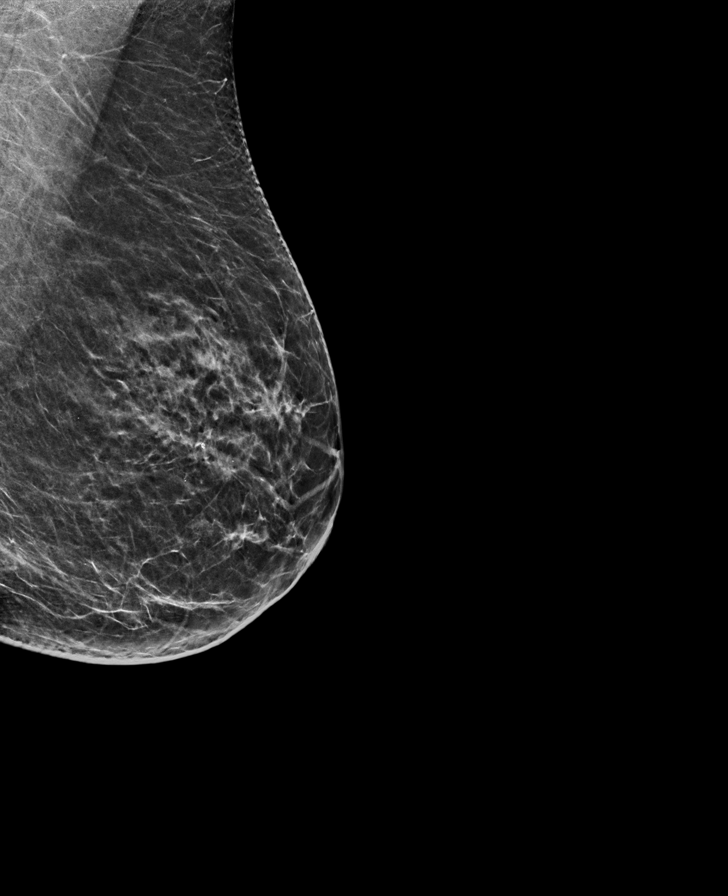

[L CC synth-2D]
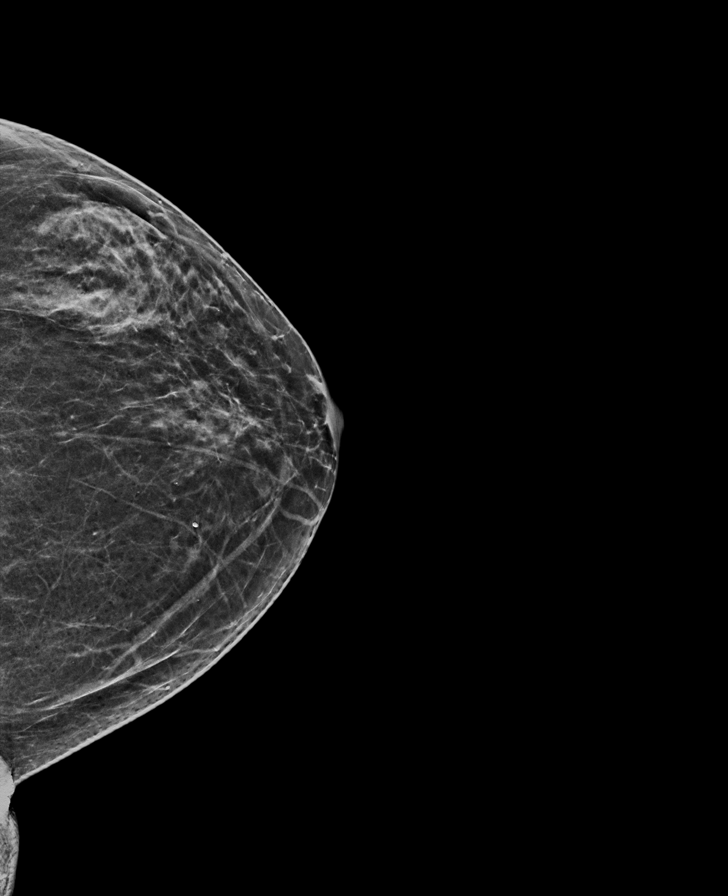

[L MLO tomo · tomo slice 31/60.0]
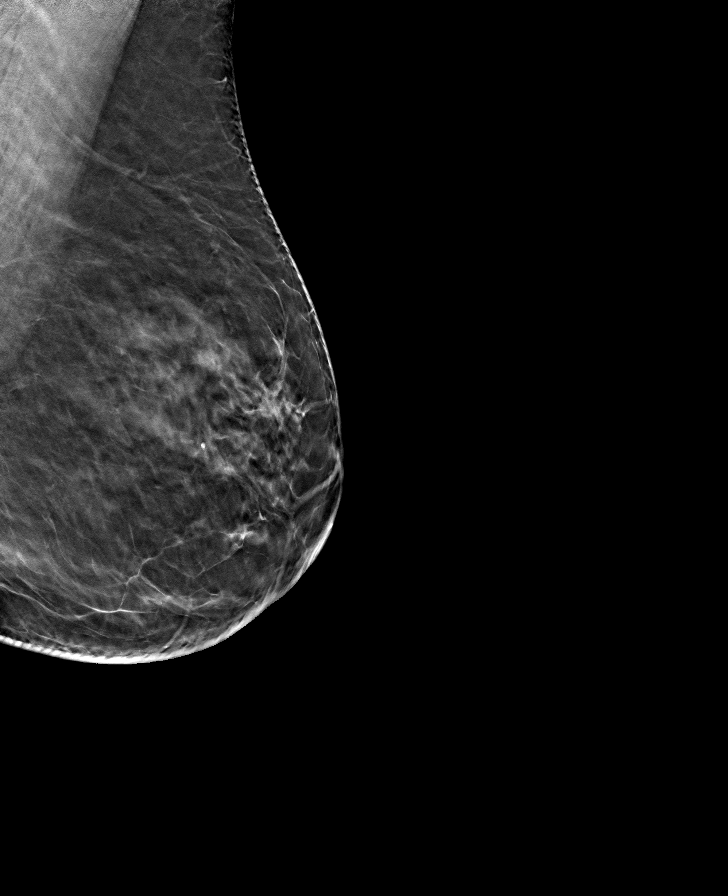

[R MLO tomo · tomo slice 33/64.0]
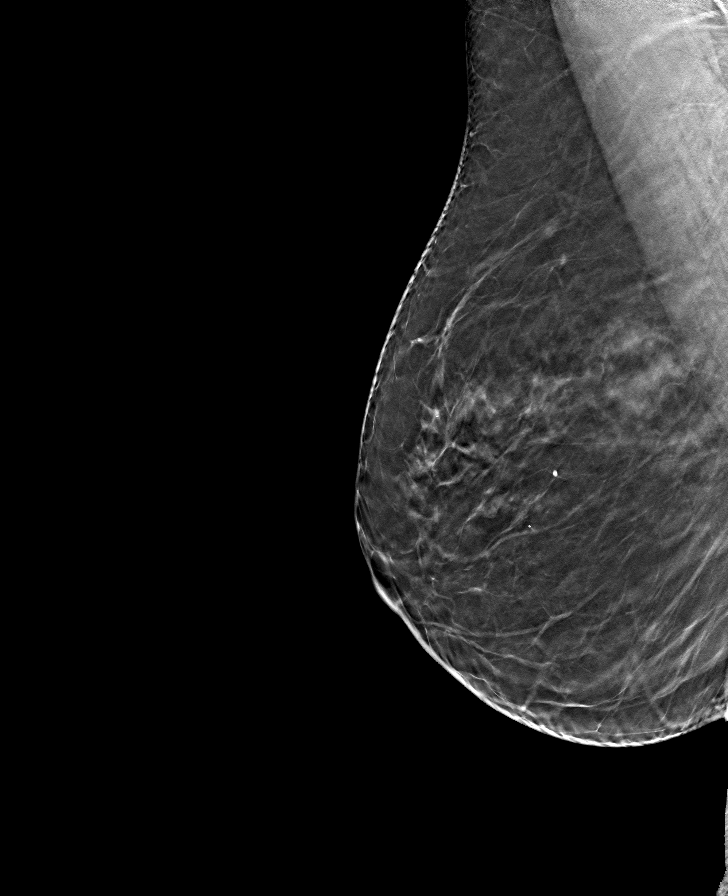

[R CC tomo · tomo slice 31/60.0]
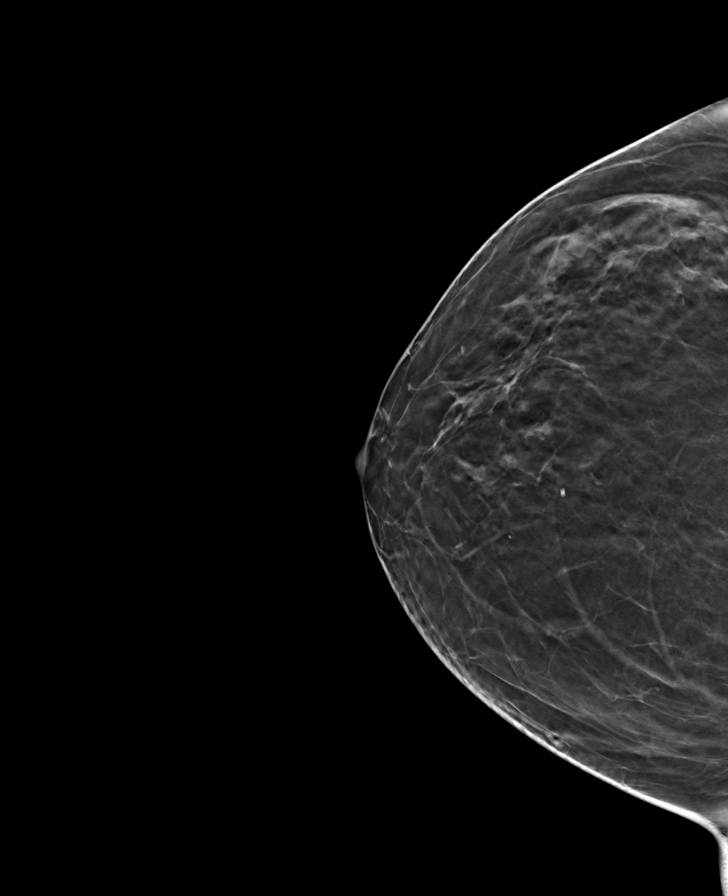

[L CC tomo · tomo slice 27/53.0]
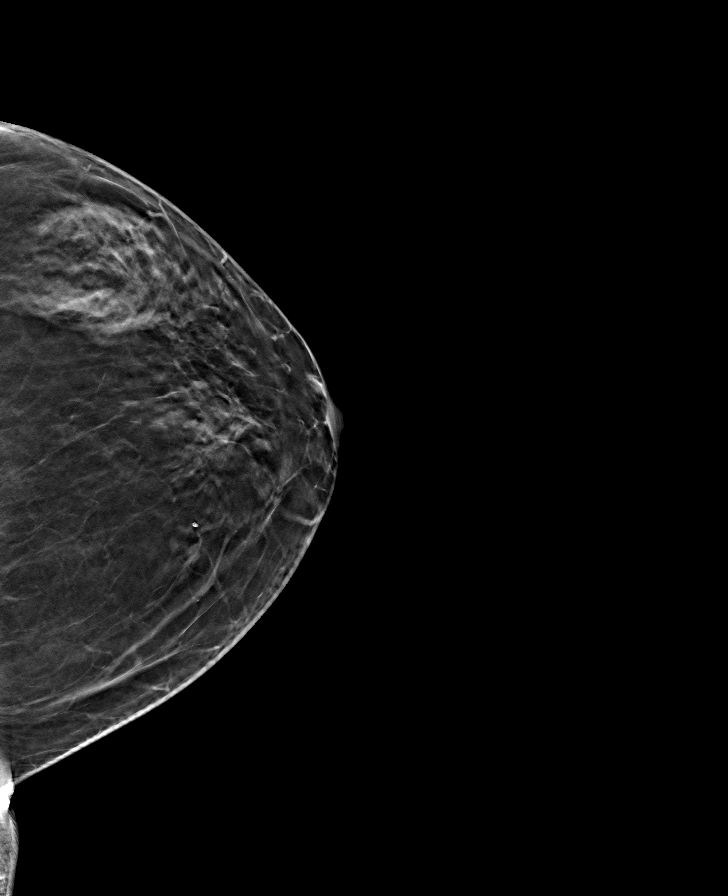

[8 of 24 positions shown; findings below may reference images not displayed]

ACR Breast Density Category b: There are scattered areas of
fibroglandular density.
FINDINGS: There are no findings suspicious for malignancy.
IMPRESSION: No mammographic evidence of malignancy. A result letter of this
screening mammogram will be mailed directly to the patient.

RECOMMENDATION:
Screening mammogram in one year. (Code:51-O-LD2)

BI-RADS CATEGORY  1: Negative.

## 2023-06-20 ENCOUNTER — Other Ambulatory Visit (INDEPENDENT_AMBULATORY_CARE_PROVIDER_SITE_OTHER): Payer: Self-pay | Admitting: Nurse Practitioner

## 2023-06-20 DIAGNOSIS — I83813 Varicose veins of bilateral lower extremities with pain: Secondary | ICD-10-CM

## 2023-06-24 ENCOUNTER — Ambulatory Visit (INDEPENDENT_AMBULATORY_CARE_PROVIDER_SITE_OTHER): Payer: 59

## 2023-06-24 ENCOUNTER — Ambulatory Visit (INDEPENDENT_AMBULATORY_CARE_PROVIDER_SITE_OTHER): Payer: Self-pay | Admitting: Vascular Surgery

## 2023-06-24 ENCOUNTER — Encounter (INDEPENDENT_AMBULATORY_CARE_PROVIDER_SITE_OTHER): Payer: Self-pay | Admitting: Vascular Surgery

## 2023-06-24 VITALS — BP 137/4 | HR 80 | Resp 18 | Ht 63.0 in | Wt 167.2 lb

## 2023-06-24 DIAGNOSIS — I83819 Varicose veins of unspecified lower extremities with pain: Secondary | ICD-10-CM | POA: Insufficient documentation

## 2023-06-24 DIAGNOSIS — I83813 Varicose veins of bilateral lower extremities with pain: Secondary | ICD-10-CM | POA: Diagnosis not present

## 2023-06-24 DIAGNOSIS — I872 Venous insufficiency (chronic) (peripheral): Secondary | ICD-10-CM

## 2023-06-24 NOTE — Progress Notes (Signed)
MRN : [id number]  Becky Krueger is a 57 y.o. (1966-04-22) female who presents with chief complaint of varicose veins hurt.  History of Present Illness: The patient is seen for evaluation of symptomatic varicose veins. The patient relates burning and stinging which worsened steadily throughout the course of the day, particularly with standing. The patient also notes an aching and throbbing pain over the varicosities, particularly with prolonged dependent positions. The symptoms are significantly improved with elevation.  The patient also notes that during hot weather the symptoms are greatly intensified. The patient states the pain from the varicose veins interferes with work, daily exercise, shopping and household maintenance. At this point, the symptoms are persistent and severe enough that they're having a negative impact on lifestyle and are interfering with daily activities.  There is no history of DVT, PE or superficial thrombophlebitis. There is no history of ulceration or hemorrhage. The patient denies a significant family history of varicose veins.  The patient has not worn graduated compression in the past. At the present time the patient has not been using over-the-counter analgesics. There is no history of prior surgical intervention or sclerotherapy.  Duplex ultrasound of the venous system bilaterally demonstrates normal deep venous system.  The great saphenous veins demonstrate reflux bilaterally.   Current Meds  Medication Sig   carvedilol (COREG) 25 MG tablet Take 25 mg by mouth 2 (two) times daily with a meal.   cyanocobalamin 1000 MCG tablet Take 1,000 mcg by mouth daily.   famotidine (PEPCID) 40 MG tablet Take 40 mg by mouth at bedtime.   losartan-hydrochlorothiazide (HYZAAR) 100-25 MG tablet Take 1 tablet by mouth daily.   metFORMIN (GLUCOPHAGE-XR) 500 MG 24 hr tablet Take 1,000 mg by mouth daily with breakfast.   Multiple Vitamin (MULTIVITAMIN) tablet  Take 1 tablet by mouth daily.   pravastatin (PRAVACHOL) 20 MG tablet Take 20 mg by mouth daily.    Past Medical History:  Diagnosis Date   Diabetes mellitus without complication (HCC)    Hyperlipidemia    Hypertension    Varicose veins of legs     Past Surgical History:  Procedure Laterality Date   CARDIAC CATHETERIZATION     LBBB on EKG.  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  S/P cath showing no blockages and normal EF   COLONOSCOPY WITH PROPOFOL N/A 10/09/2016   Procedure: COLONOSCOPY WITH PROPOFOL;  Surgeon: Christena Deem, MD;  Location: [facility] ENDOSCOPY;  Service: Endoscopy;  Laterality: N/A;   COLONOSCOPY WITH PROPOFOL N/A 07/25/2022   Procedure: COLONOSCOPY WITH PROPOFOL;  Surgeon: Toledo, Boykin Nearing, MD;  Location: [facility] ENDOSCOPY;  Service: Gastroenterology;  Laterality: N/A;  SPANISH INTERPRETER    Social History Social History   Tobacco Use   Smoking status: Never   Smokeless tobacco: Never  Vaping Use   Vaping status: Never Used  Substance Use Topics   Alcohol use: No   Drug use: No    Family History Family History  Problem Relation Age of Onset   Parkinsonism Mother    Diabetes type II Father    Hypertension Sister    Breast cancer Neg Hx     Allergies  Allergen Reactions   Penicillins Other (See Comments)    Anxiety     REVIEW OF SYSTEMS (Negative unless checked)  Constitutional: [] Weight loss  [] Fever  [] Chills Cardiac: [] Chest pain   [] Chest pressure   [] Palpitations   [] Shortness of  breath when laying flat   [] Shortness of breath with exertion. Vascular:  [] Pain in legs with walking   [x] Pain in legs with standing  [] History of DVT   [] Phlebitis   [] Swelling in legs   [x] Varicose veins   [] Non-healing ulcers Pulmonary:   [] Uses home oxygen   [] Productive cough   [] Hemoptysis   [] Wheeze  [] COPD   [] Asthma Neurologic:  [] Dizziness   [] Seizures   [] History of stroke   [] History of TIA  [] Aphasia   [] Vissual changes   [] Weakness or numbness  in arm   [] Weakness or numbness in leg Musculoskeletal:   [] Joint swelling   [] Joint pain   [] Low back pain Hematologic:  [] Easy bruising  [] Easy bleeding   [] Hypercoagulable state   [] Anemic Gastrointestinal:  [] Diarrhea   [] Vomiting  [] Gastroesophageal reflux/heartburn   [] Difficulty swallowing. Genitourinary:  [] Chronic kidney disease   [] Difficult urination  [] Frequent urination   [] Blood in urine Skin:  [] Rashes   [] Ulcers  Psychological:  [] History of anxiety   []  History of major depression.  Physical Examination  Vitals:   06/24/23 1436  BP: (!) 137/4  Pulse: 80  Resp: 18  Weight: 167 lb 3.2 oz (75.8 kg)  Height: 5\' 3"  (1.6 m)   Body mass index is 29.62 kg/m. Gen: WD/WN, NAD Head: [place]/AT, No temporalis wasting.  Ear/Nose/Throat: Hearing grossly intact, nares w/o erythema or drainage, pinna without lesions Eyes: PER, EOMI, sclera nonicteric.  Neck: Supple, no gross masses.  No JVD.  Pulmonary:  Good air movement, no audible wheezing, no use of accessory muscles.  Cardiac: RRR, precordium not hyperdynamic. Vascular:  Large varicosities present, greater than 10 mm bilaterally.  Veins are tender to palpation  Mild venous stasis changes to the legs bilaterally.  Trace soft pitting edema CEAP C3sEpAsPr Vessel Right Left  Radial Palpable Palpable  Gastrointestinal: soft, non-distended. No guarding/no peritoneal signs.  Musculoskeletal: M/S 5/5 throughout.  No deformity.  Neurologic: CN 2-12 intact. Pain and light touch intact in extremities.  Symmetrical.  Speech is fluent. Motor exam as listed above. Psychiatric: Judgment intact, Mood & affect appropriate for pt's clinical situation. Dermatologic: Venous rashes no ulcers noted.  No changes consistent with cellulitis. Lymph : No lichenification or skin changes of chronic lymphedema.  CBC No results found for: "WBC", "HGB", "HCT", "MCV", "PLT"  BMET No results found for: "NA", "K", "CL", "CO2", "GLUCOSE", "BUN", "CREATININE",  "CALCIUM", "GFRNONAA", "GFRAA" CrCl cannot be calculated (No successful lab value found.).  COAG No results found for: "INR", "PROTIME"  Radiology VAS [place] LOWER EXTREMITY VENOUS REFLUX  Result Date: 06/24/2023  Lower Venous Reflux Study Patient Name:  DEMMI FUGATE  Date of Exam:   06/24/2023 Medical Rec #: [id number]         Accession #:    [id number] Date of Birth: 1966-04-22         Patient Gender: F Patient Age:   69 years Exam Location:  [facility] Procedure:      VAS [place] LOWER EXTREMITY VENOUS REFLUX Referring Phys: Sheppard Plumber --------------------------------------------------------------------------------  Indications: Swelling, and Edema.  Performing Technologist: Hardie Lora RVT  Examination Guidelines: A complete evaluation includes B-mode imaging, spectral Doppler, color Doppler, and power Doppler as needed of all accessible portions of each vessel. Bilateral testing is considered an integral part of a complete examination. Limited examinations for reoccurring indications may be performed as noted. The reflux portion of the exam is performed with the patient in reverse Trendelenburg. Significant venous reflux is defined  as >500 ms in the superficial venous system, and >1 second in the deep venous system.  Venous Reflux Times +--------------+---------+------+-----------+------------+--------+ RIGHT         Reflux NoRefluxReflux TimeDiameter cmsComments                         Yes                                  +--------------+---------+------+-----------+------------+--------+ CFV           no                                             +--------------+---------+------+-----------+------------+--------+ FV prox       no                                             +--------------+---------+------+-----------+------------+--------+ FV mid        no                                              +--------------+---------+------+-----------+------------+--------+ FV dist       no                                             +--------------+---------+------+-----------+------------+--------+ Popliteal     no                                             +--------------+---------+------+-----------+------------+--------+ GSV at SFJ              yes    >500 ms      0.70             +--------------+---------+------+-----------+------------+--------+ GSV prox thigh          yes    >500 ms      0.63             +--------------+---------+------+-----------+------------+--------+ GSV mid thigh           yes    >500 ms      0.57             +--------------+---------+------+-----------+------------+--------+ GSV dist thigh          yes    >500 ms      0.87             +--------------+---------+------+-----------+------------+--------+ GSV at knee             yes    >500 ms      0.52             +--------------+---------+------+-----------+------------+--------+ GSV prox calf           yes    >500 ms      1.22             +--------------+---------+------+-----------+------------+--------+ SSV Pop Fossa no  0.28             +--------------+---------+------+-----------+------------+--------+ SSV prox calf no                            0.26             +--------------+---------+------+-----------+------------+--------+ SSV mid calf            yes    >500 ms      0.28             +--------------+---------+------+-----------+------------+--------+  +----------------+---------+------+-----------+------------+--------+ LEFT            Reflux NoRefluxReflux TimeDiameter cmsComments                           Yes                                  +----------------+---------+------+-----------+------------+--------+ CFV             no                                              +----------------+---------+------+-----------+------------+--------+ FV prox         no                                             +----------------+---------+------+-----------+------------+--------+ FV mid          no                                             +----------------+---------+------+-----------+------------+--------+ FV dist         no                                             +----------------+---------+------+-----------+------------+--------+ Popliteal       no                                             +----------------+---------+------+-----------+------------+--------+ GSV at [facility]      no                            0.60             +----------------+---------+------+-----------+------------+--------+ GSV prox thigh  no                            0.47             +----------------+---------+------+-----------+------------+--------+ GSV mid thigh   no                            0.38             +----------------+---------+------+-----------+------------+--------+  GSV dist thigh  no                            0.33             +----------------+---------+------+-----------+------------+--------+ GSV at knee     no                            0.34             +----------------+---------+------+-----------+------------+--------+ GSV prox calf             yes    >500 ms      0.30             +----------------+---------+------+-----------+------------+--------+ SSV Pop Fossa   no                            0.27             +----------------+---------+------+-----------+------------+--------+ SSV prox calf   no                            0.30             +----------------+---------+------+-----------+------------+--------+ SSV mid calf    no                            0.36             +----------------+---------+------+-----------+------------+--------+ AAGSV SFJ                 yes    >500 ms      0.54              +----------------+---------+------+-----------+------------+--------+ AAGSV prox thigh          yes    >500 ms      0.98             +----------------+---------+------+-----------+------------+--------+   Summary: Right: - No evidence of deep vein thrombosis seen in the right lower extremity, from the common femoral through the popliteal veins. - No evidence of superficial venous thrombosis in the right lower extremity. - Venous reflux is noted in the right sapheno-femoral junction. - Venous reflux is noted in the right greater saphenous vein in the thigh. - Venous reflux is noted in the right greater saphenous vein in the calf.  Left: - No evidence of deep vein thrombosis seen in the left lower extremity, from the common femoral through the popliteal veins. - No evidence of superficial venous thrombosis in the left lower extremity. - Venous reflux is noted in the left greater saphenous vein in the thigh. - Venous reflux is noted in the left greater saphenous vein in the calf. - The anterior accessory great saphenous vein demonstrates reflux.  *See table(s) above for measurements and observations. Electronically signed by Levora Dredge MD on 06/24/2023 at 4:28:05 PM.    Final      Assessment/Plan 1. Varicose veins with pain  Recommend:  The patient has large symptomatic varicose veins that are painful and associated with swelling. The patient is CEAP C4sEpAsPr   I have had a long discussion with the patient regarding  varicose veins and why they cause symptoms.  Patient will begin wearing graduated compression stockings class 1 on a daily basis, beginning first thing in the  morning and removing them in the evening. The patient is instructed specifically not to sleep in the stockings.    The patient  will also begin using over-the-counter analgesics such as Motrin 600 mg po TID to help control the symptoms.    In addition, behavioral modification including elevation during the day  will be initiated.    Pending the results of these changes the  patient will be reevaluated in three months.   An ultrasound of the venous system shows reflux in the bilateral great saphenous veins.   Further plans will be based on whether conservative therapies are successful at eliminating the pain and swelling.   2. Chronic venous insufficiency Recommend:  The patient has large symptomatic varicose veins that are painful and associated with swelling. The patient is CEAP C4sEpAsPr   I have had a long discussion with the patient regarding  varicose veins and why they cause symptoms.  Patient will begin wearing graduated compression stockings class 1 on a daily basis, beginning first thing in the morning and removing them in the evening. The patient is instructed specifically not to sleep in the stockings.    The patient  will also begin using over-the-counter analgesics such as Motrin 600 mg po TID to help control the symptoms.    In addition, behavioral modification including elevation during the day will be initiated.    Pending the results of these changes the  patient will be reevaluated in three months.   An ultrasound of the venous system shows reflux in the bilateral great saphenous veins.   Further plans will be based on whether conservative therapies are successful at eliminating the pain and swelling.     Levora Dredge, MD  06/24/2023 8:44 PM

## 2023-09-02 DIAGNOSIS — Z0000 Encounter for general adult medical examination without abnormal findings: Secondary | ICD-10-CM | POA: Diagnosis not present

## 2023-09-02 DIAGNOSIS — E1165 Type 2 diabetes mellitus with hyperglycemia: Secondary | ICD-10-CM | POA: Diagnosis not present

## 2023-09-21 NOTE — Progress Notes (Unsigned)
 MRN : [id number]  Becky Krueger is a 58 y.o. (Jul 14, 1966) female who presents with chief complaint of varicose veins hurt.  History of Present Illness:   The patient returns for followup evaluation 3 months after the initial visit. The patient continues to have pain in the lower extremities with dependency. The pain is lessened with elevation. Graduated compression stockings, Class I (20-30 mmHg), have been worn but the stockings do not eliminate the leg pain. Over-the-counter analgesics do not improve the symptoms. The degree of discomfort continues to interfere with daily activities. The patient notes the pain in the legs is causing problems with daily exercise, at the workplace and even with household activities and maintenance such as standing in the kitchen preparing meals and doing dishes.   Venous ultrasound shows normal deep venous system, no evidence of acute or chronic DVT.  Superficial reflux is present in the  great saphenous veins bilaterally.   No outpatient medications have been marked as taking for the 09/23/23 encounter (Appointment) with Gilda Crease, Latina Craver, MD.    Past Medical History:  Diagnosis Date   Diabetes mellitus without complication (HCC)    Hyperlipidemia    Hypertension    Varicose veins of legs     Past Surgical History:  Procedure Laterality Date   CARDIAC CATHETERIZATION     LBBB on EKG.  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  S/P cath showing no blockages and normal EF   COLONOSCOPY WITH PROPOFOL N/A 10/09/2016   Procedure: COLONOSCOPY WITH PROPOFOL;  Surgeon: Christena Deem, MD;  Location: [facility] ENDOSCOPY;  Service: Endoscopy;  Laterality: N/A;   COLONOSCOPY WITH PROPOFOL N/A 07/25/2022   Procedure: COLONOSCOPY WITH PROPOFOL;  Surgeon: Toledo, Boykin Nearing, MD;  Location: [facility] ENDOSCOPY;  Service: Gastroenterology;  Laterality: N/A;  SPANISH INTERPRETER    Social History Social History   Tobacco Use   Smoking status: Never    Smokeless tobacco: Never  Vaping Use   Vaping status: Never Used  Substance Use Topics   Alcohol use: No   Drug use: No    Family History Family History  Problem Relation Age of Onset   Parkinsonism Mother    Diabetes type II Father    Hypertension Sister    Breast cancer Neg Hx     Allergies  Allergen Reactions   Penicillins Other (See Comments)    Anxiety     REVIEW OF SYSTEMS (Negative unless checked)  Constitutional: [] Weight loss  [] Fever  [] Chills Cardiac: [] Chest pain   [] Chest pressure   [] Palpitations   [] Shortness of breath when laying flat   [] Shortness of breath with exertion. Vascular:  [] Pain in legs with walking   [x] Pain in legs with standing  [] History of DVT   [] Phlebitis   [] Swelling in legs   [x] Varicose veins   [] Non-healing ulcers Pulmonary:   [] Uses home oxygen   [] Productive cough   [] Hemoptysis   [] Wheeze  [] COPD   [] Asthma Neurologic:  [] Dizziness   [] Seizures   [] History of stroke   [] History of TIA  [] Aphasia   [] Vissual changes   [] Weakness or numbness in arm   [] Weakness or numbness in leg Musculoskeletal:   [] Joint swelling   [] Joint pain   [] Low back pain Hematologic:  [] Easy bruising  [] Easy bleeding   [] Hypercoagulable state   [] Anemic Gastrointestinal:  [] Diarrhea   [] Vomiting  [] Gastroesophageal reflux/heartburn   [] Difficulty swallowing. Genitourinary:  [] Chronic kidney  disease   [] Difficult urination  [] Frequent urination   [] Blood in urine Skin:  [] Rashes   [] Ulcers  Psychological:  [] History of anxiety   []  History of major depression.  Physical Examination  There were no vitals filed for this visit. There is no height or weight on file to calculate BMI. Gen: WD/WN, NAD Head: [place]/AT, No temporalis wasting.  Ear/Nose/Throat: Hearing grossly intact, nares w/o erythema or drainage, pinna without lesions Eyes: PER, EOMI, sclera nonicteric.  Neck: Supple, no gross masses.  No JVD.  Pulmonary:  Good air movement, no audible wheezing,  no use of accessory muscles.  Cardiac: RRR, precordium not hyperdynamic. Vascular:  Large varicosities present, greater than 10 mm ***.  Veins are tender to palpation  Mild venous stasis changes to the legs bilaterally.  Trace soft pitting edema CEAP C3sEpAsPr Vessel Right Left  Radial Palpable Palpable  Gastrointestinal: soft, non-distended. No guarding/no peritoneal signs.  Musculoskeletal: M/S 5/5 throughout.  No deformity.  Neurologic: CN 2-12 intact. Pain and light touch intact in extremities.  Symmetrical.  Speech is fluent. Motor exam as listed above. Psychiatric: Judgment intact, Mood & affect appropriate for pt's clinical situation. Dermatologic: Venous rashes no ulcers noted.  No changes consistent with cellulitis. Lymph : No lichenification or skin changes of chronic lymphedema.  CBC No results found for: "WBC", "HGB", "HCT", "MCV", "PLT"  BMET No results found for: "NA", "K", "CL", "CO2", "GLUCOSE", "BUN", "CREATININE", "CALCIUM", "GFRNONAA", "GFRAA" CrCl cannot be calculated (No successful lab value found.).  COAG No results found for: "INR", "PROTIME"  Radiology No results found.   Assessment/Plan There are no diagnoses linked to this encounter.   Levora Dredge, MD  09/21/2023 3:17 PM

## 2023-09-23 ENCOUNTER — Encounter (INDEPENDENT_AMBULATORY_CARE_PROVIDER_SITE_OTHER): Payer: Self-pay | Admitting: Vascular Surgery

## 2023-09-23 ENCOUNTER — Ambulatory Visit (INDEPENDENT_AMBULATORY_CARE_PROVIDER_SITE_OTHER): Payer: 59 | Admitting: Vascular Surgery

## 2023-09-23 VITALS — BP 143/82 | HR 77 | Resp 16 | Wt 170.0 lb

## 2023-09-23 DIAGNOSIS — I872 Venous insufficiency (chronic) (peripheral): Secondary | ICD-10-CM | POA: Diagnosis not present

## 2023-09-23 DIAGNOSIS — I83819 Varicose veins of unspecified lower extremities with pain: Secondary | ICD-10-CM

## 2023-09-26 ENCOUNTER — Encounter (INDEPENDENT_AMBULATORY_CARE_PROVIDER_SITE_OTHER): Payer: Self-pay | Admitting: Vascular Surgery

## 2023-10-15 ENCOUNTER — Other Ambulatory Visit: Payer: Self-pay | Admitting: Nurse Practitioner

## 2023-10-15 DIAGNOSIS — Z1231 Encounter for screening mammogram for malignant neoplasm of breast: Secondary | ICD-10-CM

## 2023-10-15 DIAGNOSIS — Z124 Encounter for screening for malignant neoplasm of cervix: Secondary | ICD-10-CM | POA: Diagnosis not present

## 2023-10-22 ENCOUNTER — Ambulatory Visit
Admission: RE | Admit: 2023-10-22 | Discharge: 2023-10-22 | Disposition: A | Discharge status: Home / Self Care | Source: Ambulatory Visit | Attending: Nurse Practitioner | Admitting: Nurse Practitioner

## 2023-10-22 DIAGNOSIS — Z1231 Encounter for screening mammogram for malignant neoplasm of breast: Secondary | ICD-10-CM | POA: Diagnosis not present

## 2023-11-25 DIAGNOSIS — J309 Allergic rhinitis, unspecified: Secondary | ICD-10-CM | POA: Diagnosis not present

## 2023-11-25 DIAGNOSIS — M436 Torticollis: Secondary | ICD-10-CM | POA: Diagnosis not present

## 2023-11-25 DIAGNOSIS — R42 Dizziness and giddiness: Secondary | ICD-10-CM | POA: Diagnosis not present

## 2023-12-13 ENCOUNTER — Telehealth (INDEPENDENT_AMBULATORY_CARE_PROVIDER_SITE_OTHER): Payer: Self-pay | Admitting: Vascular Surgery

## 2023-12-13 NOTE — Telephone Encounter (Signed)
 VIA interpreter services, LVM for pt TCB and schedule laser ablation procedure with Dr. Prescilla Brod.  left leg GSV laser. see gs. auth # [id number] exp: 5.19.25 - 11.08.25   1 week poast left leg GSV laser   4 week post left leg GSV laser. see fb/gs

## 2023-12-24 ENCOUNTER — Telehealth (INDEPENDENT_AMBULATORY_CARE_PROVIDER_SITE_OTHER): Payer: Self-pay | Admitting: Vascular Surgery

## 2023-12-24 ENCOUNTER — Encounter (INDEPENDENT_AMBULATORY_CARE_PROVIDER_SITE_OTHER): Payer: Self-pay

## 2023-12-24 NOTE — Telephone Encounter (Signed)
 VIA interpreter services, ATC pt to schedule laser ablation appt. VM has not been set up and unable to LM.   left leg GSV laser. see gs. auth # [id number] exp: 5.19.25 - 11.08.25    1 week poast left leg GSV laser    4 week post left leg GSV laser. see fb/gs

## 2024-05-14 DIAGNOSIS — E782 Mixed hyperlipidemia: Secondary | ICD-10-CM | POA: Diagnosis not present

## 2024-05-14 DIAGNOSIS — Z23 Encounter for immunization: Secondary | ICD-10-CM | POA: Diagnosis not present

## 2024-05-14 DIAGNOSIS — Z1331 Encounter for screening for depression: Secondary | ICD-10-CM | POA: Diagnosis not present

## 2024-05-14 DIAGNOSIS — E119 Type 2 diabetes mellitus without complications: Secondary | ICD-10-CM | POA: Diagnosis not present

## 2024-05-14 DIAGNOSIS — I10 Essential (primary) hypertension: Secondary | ICD-10-CM | POA: Diagnosis not present

## 2024-05-14 DIAGNOSIS — Z79899 Other long term (current) drug therapy: Secondary | ICD-10-CM | POA: Diagnosis not present

## 2024-05-14 DIAGNOSIS — K219 Gastro-esophageal reflux disease without esophagitis: Secondary | ICD-10-CM | POA: Diagnosis not present

## 2024-07-17 ENCOUNTER — Ambulatory Visit
Admission: EM | Admit: 2024-07-17 | Discharge: 2024-07-17 | Disposition: A | Discharge status: Home / Self Care | Attending: Emergency Medicine | Admitting: Emergency Medicine

## 2024-07-17 DIAGNOSIS — M5442 Lumbago with sciatica, left side: Secondary | ICD-10-CM | POA: Diagnosis not present

## 2024-07-17 MED ORDER — ACETAMINOPHEN 325 MG PO TABS: 650.0000 mg | ORAL_TABLET | Freq: Four times a day (QID) | ORAL | 0 refills | Status: AC | PRN

## 2024-07-17 MED ORDER — CYCLOBENZAPRINE HCL 5 MG PO TABS: 5.0000 mg | ORAL_TABLET | Freq: Three times a day (TID) | ORAL | 0 refills | Status: AC | PRN

## 2024-07-17 NOTE — ED Provider Notes (Signed)
 [facility]    CSN: [id number] Arrival date & time: 07/17/24  1122      History   Chief Complaint Chief Complaint  Patient presents with   Hip Pain    HPI Becky Krueger is a 58 y.o. female.   Becky Krueger, 58 year old female,  presents to urgent care for evaluation of left sided back pain/hip pain that radiates to left knee, pain started 4 days ago. Pt deneis fall or trauam, lots of bending,twisting per husband report. No loss of bowel and bladder,no loss of function ,no saddle numbness.   The history is provided by the patient. Language interpreter used: pt offered interpretor services,declined will use husband.  Hip Pain Pertinent negatives include no abdominal pain.    Past Medical History:  Diagnosis Date   Diabetes mellitus without complication (HCC)    Hyperlipidemia    Hypertension    Varicose veins of legs     Patient Active Problem List   Diagnosis Date Noted   Acute midline low back pain with left-sided sciatica 07/17/2024   Varicose veins with pain 06/24/2023   Chronic venous insufficiency 06/24/2023    Past Surgical History:  Procedure Laterality Date   CARDIAC CATHETERIZATION     LBBB on EKG.  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  S/P cath showing no blockages and normal EF   COLONOSCOPY WITH PROPOFOL  N/A 10/09/2016   Procedure: COLONOSCOPY WITH PROPOFOL ;  Surgeon: Gladis RAYMOND Mariner, MD;  Location: [facility];  Service: Endoscopy;  Laterality: N/A;   COLONOSCOPY WITH PROPOFOL  N/A 07/25/2022   Procedure: COLONOSCOPY WITH PROPOFOL ;  Surgeon: Toledo, Ladell POUR, MD;  Location: [facility] ENDOSCOPY;  Service: Gastroenterology;  Laterality: N/A;  SPANISH INTERPRETER    OB History   No obstetric history on file.      Home Medications    Prior to Admission medications  Medication Sig Start Date End Date Taking? Authorizing Provider  acetaminophen (TYLENOL) 325 MG tablet Take 2 tablets (650 mg total) by mouth every 6 (six) hours  as needed. 07/17/24  Yes Ernst Cumpston, NP  cyclobenzaprine (FLEXERIL) 5 MG tablet Take 1 tablet (5 mg total) by mouth 3 (three) times daily as needed for up to 5 days. 07/17/24 07/22/24 Yes Daouda Lonzo, NP  carvedilol (COREG) 25 MG tablet Take 25 mg by mouth 2 (two) times daily with a meal.    [provider]  cyanocobalamin 1000 MCG tablet Take 1,000 mcg by mouth daily.    [provider]  famotidine (PEPCID) 40 MG tablet Take 40 mg by mouth at bedtime. 10/25/21   [provider]  losartan-hydrochlorothiazide (HYZAAR) 100-25 MG tablet Take 1 tablet by mouth daily.    [provider]  metFORMIN (GLUCOPHAGE-XR) 500 MG 24 hr tablet Take 1,000 mg by mouth daily with breakfast.    [provider]  Multiple Vitamin (MULTIVITAMIN) tablet Take 1 tablet by mouth daily.    [provider]  pravastatin (PRAVACHOL) 20 MG tablet Take 20 mg by mouth daily.    [provider]    Family History Family History  Problem Relation Age of Onset   Parkinsonism Mother    Diabetes type II Father    Hypertension Sister    Breast cancer Neg Hx     Social History Social History[1]   Allergies   Penicillins   Review of Systems Review of Systems  Gastrointestinal:  Negative for abdominal pain, nausea and vomiting.  Genitourinary:  Negative for dysuria.  Musculoskeletal:  Positive for  arthralgias, back pain and myalgias.  Skin: Negative.   All other systems reviewed and are negative.    Physical Exam Triage Vital Signs ED Triage Vitals  Encounter Vitals Group     BP 07/17/24 1205 (!) 148/81     Girls Systolic BP Percentile --      Girls Diastolic BP Percentile --      Boys Systolic BP Percentile --      Boys Diastolic BP Percentile --      Pulse Rate 07/17/24 1205 85     Resp 07/17/24 1205 16     Temp 07/17/24 1205 98.3 F (36.8 C)     Temp Source 07/17/24 1205 Oral     SpO2 07/17/24 1205 98 %     Weight 07/17/24 1206  170 lb (77.1 kg)     Height --      Head Circumference --      Peak Flow --      Pain Score 07/17/24 1205 8     Pain Loc --      Pain Education --      Exclude from Growth Chart --    No data found.  Updated Vital Signs BP (!) 148/81 (BP Location: Left Arm)   Pulse 85   Temp 98.3 F (36.8 C) (Oral)   Resp 16   Wt 170 lb (77.1 kg)   LMP  (LMP Unknown)   SpO2 98%   BMI 30.11 kg/m   Visual Acuity Right Eye Distance:   Left Eye Distance:   Bilateral Distance:    Right Eye Near:   Left Eye Near:    Bilateral Near:     Physical Exam Vitals and nursing note reviewed.  Constitutional:      General: She is not in acute distress.    Appearance: She is well-developed and well-groomed. She is not ill-appearing or toxic-appearing.  Cardiovascular:     Rate and Rhythm: Normal rate and regular rhythm.     Pulses: Normal pulses.          Dorsalis pedis pulses are 2+ on the right side and 2+ on the left side.     Heart sounds: Normal heart sounds.  Musculoskeletal:     Lumbar back: Spasms and tenderness present. No swelling, edema, deformity, lacerations or bony tenderness. Normal range of motion. Negative right straight leg raise test and negative left straight leg raise test.     Comments: -SLE bilateral, dorsiflex/ext equal bilateral, MAEW, no foot drop, NV intact  Neurological:     General: No focal deficit present.     Mental Status: She is alert and oriented to person, place, and time.     GCS: GCS eye subscore is 4. GCS verbal subscore is 5. GCS motor subscore is 6.     Cranial Nerves: No cranial nerve deficit.     Sensory: Sensation is intact. No sensory deficit.     Motor: Motor function is intact.     Coordination: Coordination is intact.     Gait: Gait is intact.  Psychiatric:        Attention and Perception: Attention normal.        Mood and Affect: Mood normal.        Speech: Speech normal.        Behavior: Behavior is cooperative.      UC Treatments /  Results  Labs (all labs ordered are listed, but only abnormal results are displayed) Labs Reviewed - No data to display  EKG   Radiology No results found.  Procedures Procedures (including critical care time)  Medications Ordered in UC Medications - No data to display  Initial Impression / Assessment and Plan / UC Course  I have reviewed the triage vital signs and the nursing notes.  Pertinent labs & imaging results that were available during my care of the patient were reviewed by me and considered in my medical decision making (see chart for details).    Discussed exam findings and plan of care with patient, patient scripted Flexeril and Tylenol for pain , no red flags at today's visit, strict go to ER precautions given.   Patient verbalized understanding to this provider.  Ddx: Low back pain, left-sided sciatica, muscle spasm, arthralgia Final Clinical Impressions(s) / UC Diagnoses   Final diagnoses:  Acute midline low back pain with left-sided sciatica     Discharge Instructions      Take home meds as directed.  May use heat or ice to back for comfort 20 min 3 x daily.  May use lidocaine  patch or biofreeze for pain.  Please follow up with PCP, may need to referral to physical therapy for further back pain management.  GO immediately to nearest ER or call 9-1-1 for loss of bowel and bladder,loss of function, saddle numbness, etc.  Take tylenol as prescribed for pain Avoid lifting,turning,bending as this will aggravate your back  [employer] en casa segn las indicaciones.   Puede usar calor o hielo en la espalda para aliviar molestias durante 20 minutos, 3 veces al [employer].   Puede usar parche de lidocana o Biofreeze para chief technology officer.   Por favor, haga seguimiento con su mdico de atencin primaria; puede necesitar derivacin a fisioterapia para un manejo adicional del dolor de espalda.   Vaya inmediatamente a la sala de emergencias ms cercana o llame al 9-1-1  si presenta prdida del control de intestinos o vejiga, prdida de funcin, entumecimiento en silla de montar, etc.   Tome Tylenol segn lo recetado para chief technology officer.   Evite levantar, girar o doblarse, ya que esto agravar su dolor de espalda.     ED Prescriptions     Medication Sig Dispense Auth. Provider   cyclobenzaprine (FLEXERIL) 5 MG tablet Take 1 tablet (5 mg total) by mouth 3 (three) times daily as needed for up to 5 days. 15 tablet Devesh Monforte, NP   acetaminophen (TYLENOL) 325 MG tablet Take 2 tablets (650 mg total) by mouth every 6 (six) hours as needed. 24 tablet Trevin Gartrell, Rilla, NP      PDMP not reviewed this encounter.     [1]  Social History Tobacco Use   Smoking status: Never   Smokeless tobacco: Never  Vaping Use   Vaping status: Never Used  Substance Use Topics   Alcohol use: No   Drug use: No     Aminta Rilla, NP 07/17/24 2024

## 2024-07-17 NOTE — Discharge Instructions (Addendum)
 Take home meds as directed.  May use heat or ice to back for comfort 20 min 3 x daily.  May use lidocaine  patch or biofreeze for pain.  Please follow up with PCP, may need to referral to physical therapy for further back pain management.  GO immediately to nearest ER or call 9-1-1 for loss of bowel and bladder,loss of function, saddle numbness, etc.  Take tylenol as prescribed for pain Avoid lifting,turning,bending as this will aggravate your back  [employer] en casa segn las indicaciones.   Puede usar calor o hielo en la espalda para aliviar molestias durante 20 minutos, 3 veces al [employer].   Puede usar parche de lidocana o Biofreeze para chief technology officer.   Por favor, haga seguimiento con su mdico de atencin primaria; puede necesitar derivacin a fisioterapia para un manejo adicional del dolor de espalda.   Vaya inmediatamente a la sala de emergencias ms cercana o llame al 9-1-1 si presenta prdida del control de intestinos o vejiga, prdida de funcin, entumecimiento en silla de montar, etc.   Tome Tylenol segn lo recetado para chief technology officer.   Evite levantar, girar o doblarse, ya que esto agravar su dolor de espalda.

## 2024-07-17 NOTE — ED Triage Notes (Signed)
 Pt present left hip pain, symptom started four days ago.

## 2024-07-21 DIAGNOSIS — M545 Low back pain, unspecified: Secondary | ICD-10-CM | POA: Diagnosis not present

## 2024-07-21 DIAGNOSIS — R3989 Other symptoms and signs involving the genitourinary system: Secondary | ICD-10-CM | POA: Diagnosis not present

## 2024-07-21 DIAGNOSIS — G8929 Other chronic pain: Secondary | ICD-10-CM | POA: Diagnosis not present
